# Patient Record
Sex: Female | Born: 1990 | Race: Black or African American | Hispanic: No | Marital: Married
Health system: Midwestern US, Community
[De-identification: ages and names within clinical notes are randomized; demographics above are authoritative.]

## PROBLEM LIST (undated history)

## (undated) DIAGNOSIS — E282 Polycystic ovarian syndrome: Secondary | ICD-10-CM

## (undated) HISTORY — DX: Polycystic ovarian syndrome: E28.2

## (undated) HISTORY — PX: WISDOM TOOTH EXTRACTION: SHX21

## (undated) HISTORY — PX: OTHER SURGICAL HISTORY: SHX169

---
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
## 2012-02-05 ENCOUNTER — Ambulatory Visit: Payer: Self-pay | Admitting: Neurology

## 2012-11-08 LAB — HM PAP SMEAR

## 2012-11-10 ENCOUNTER — Ambulatory Visit: Payer: Self-pay

## 2013-11-20 NOTE — ED Provider Notes (Signed)
[facility]  EMERGENCY DEPARTMENT TREATMENT REPORT  NAME:  Monica Boyer, Monica Boyer  SEX:   F  ADMIT: 11/20/2013  DOB:   1990/08/31  MR#    [id number]  ROOM:    TIME DICTATED: 02 04 AM  ACCT#  [id number]        CHIEF COMPLAINT:   Anxiety.     HISTORY OF PRESENT ILLNESS:  A 23 year old female who states she was in an argument with her husband and  started having a panic attack.  She has a history of panic attacks.  Medics  got there and they were able to calm her down and get her breathing under  control.  She states now she is feeling much better but just has a headache  and feels a little bit weak.  She denies pain anywhere.  She states she is  safe at home, does not worry about her safety.  She has not have any SI or HI.  She simply got panicky and could not calm herself down.  Again, with coaching  from medics she is feeling much better.     REVIEW OF SYSTEMS:  PULMONARY:  She was short of breath at the time, not now.  CARDIOVASCULAR:  No chest pain.  She had tightness at the time.  GASTROINTESTINAL:  No abdominal pain.  GENITOURINARY:  No dysuria.  NEUROLOGIC:  Headache, but no focal neurologic complaints.    PAST MEDICAL HISTORY:  None.    SOCIAL HISTORY:  No alcohol, tobacco or drug abuse.    MEDICATIONS:  None.    ALLERGIES:  NONE.    PHYSICAL EXAMINATION:  VITAL SIGNS:  Blood pressure 120/66, pulse 93, respiratory rate 18,  temperature 97.8, O2 sat 100% on room air.  GENERAL APPEARANCE:  Patient appears well developed and well nourished.  Appearance and behavior are age and situation appropriate.  HEENT:  Eyes:  Conjunctivae clear, lids normal.  Pupils equal, symmetrical,  and normally reactive.    RESPIRATORY:  Clear and equal breath sounds.  No respiratory distress,  tachypnea, or accessory muscle use.    CARDIOVASCULAR:   Heart regular, without murmurs, gallops, rubs, or thrills.   GASTROINTESTINAL:  Abdomen soft and nontender.  EXTREMITIES:  Without edema.  SKIN:  Warm and dry.   NEUROLOGIC:  She ambulates well.  She does not have any focal neurologic  findings.   PSYCHIATRIC:  She is awake, alert, oriented and interactive.    INITIAL ASSESSMENT:  A patient with anxiety/panic attack.  She is feeling better now.  Her husband  came.  She did have some increase in anxiety.  She has reiterated though that  she does not feel uncomfortable going home with him, she feels safe, she has  no fears for her safety or the safety of others.  I did give her 1 mg of oral  Ativan and she is going to be discharged home in stable condition.    DIAGNOSIS:  Acute stress reaction, anxiety.      ___________________  Christiana PellantBen A Lynford Espinoza MD  Dictated By: Marland Kitchen.     My signature above authenticates this document and my orders, the final  diagnosis (es), discharge prescription (s), and instructions in the [facility]  Pulsecheck record.  Nursing notes have been reviewed by the physician/mid-level provider.    If you have any questions please contact [phone].    JMB  D:11/20/2013 02:04:19  T: 11/20/2013 14:33:58  [id number]  Electronically Authenticated by:  Carlis StableBen A. Carmela HurtFickenscher, M.D. On 11/22/2013 04:50  AM EDT

## 2015-07-07 DIAGNOSIS — G43909 Migraine, unspecified, not intractable, without status migrainosus: Secondary | ICD-10-CM

## 2015-07-11 ENCOUNTER — Ambulatory Visit (INDEPENDENT_AMBULATORY_CARE_PROVIDER_SITE_OTHER): Payer: BLUE CROSS/BLUE SHIELD | Admitting: Unknown Physician Specialty

## 2015-07-11 ENCOUNTER — Encounter: Payer: Self-pay | Admitting: Unknown Physician Specialty

## 2015-07-11 VITALS — BP 126/76 | HR 72 | Temp 98.5°F | Ht 62.7 in | Wt 133.8 lb

## 2015-07-11 DIAGNOSIS — Z3046 Encounter for surveillance of implantable subdermal contraceptive: Secondary | ICD-10-CM | POA: Diagnosis not present

## 2015-07-11 DIAGNOSIS — Z30011 Encounter for initial prescription of contraceptive pills: Secondary | ICD-10-CM | POA: Diagnosis not present

## 2015-07-11 MED ORDER — LEVONORGESTREL-ETHINYL ESTRAD 0.1-20 MG-MCG PO TABS: 1.0000 | ORAL_TABLET | Freq: Every day | ORAL | Status: DC

## 2015-07-11 NOTE — Progress Notes (Signed)
   BP 126/76 mmHg  Pulse 72  Temp(Src) 98.5 F (36.9 C)  Ht 5' 2.7" (1.593 m)  Wt 133 lb 12.8 oz (60.691 kg)  BMI 23.92 kg/m2  SpO2 100%  LMP 04/09/2012 (Approximate)   Subjective:    Patient ID: Valerie Odonnell, female    DOB: [birth year], 25 y.o.   MRN: [id number]  HPI: Valerie Odonnell is a 25 y.o. female  No chief complaint on file. Nexplanon removal and family planning advice.  Pt with a Nexplanon for over 3 years and needs it removed.  She has no problems with it but wants to see if she can have a period.  She does however, need birth control  Relevant past medical, surgical, family and social history reviewed and updated as indicated. Interim medical history since our last visit reviewed. Allergies and medications reviewed and updated.  Review of Systems  Per HPI unless specifically indicated above     Objective:    BP 126/76 mmHg  Pulse 72  Temp(Src) 98.5 F (36.9 C)  Ht 5' 2.7" (1.593 m)  Wt 133 lb 12.8 oz (60.691 kg)  BMI 23.92 kg/m2  SpO2 100%  LMP 04/09/2012 (Approximate)  Wt Readings from Last 3 Encounters:  07/11/15 133 lb 12.8 oz (60.691 kg)  11/24/12 113 lb (51.256 kg)    Physical Exam  Constitutional: She is oriented to person, place, and time. She appears well-developed and well-nourished. No distress.  HENT:  Head: Normocephalic and atraumatic.  Eyes: Conjunctivae and lids are normal. Right eye exhibits no discharge. Left eye exhibits no discharge. No scleral icterus.  Cardiovascular: Normal rate.   Pulmonary/Chest: Effort normal.  Abdominal: Normal appearance. There is no splenomegaly or hepatomegaly.  Musculoskeletal: Normal range of motion.  Neurological: She is alert and oriented to person, place, and time.  Skin: Skin is intact. No rash noted. No pallor.  Psychiatric: She has a normal mood and affect. Her behavior is normal. Judgment and thought content normal.   Nexplanon located left upper arm.  Area infiltrated with lidocaine and  removed with forceps.  Steri strips applied to area.   Tolerated procedure well Results for orders placed or performed in visit on 07/07/15  HM PAP SMEAR  Result Value Ref Range   HM Pap smear from [facility]       Assessment & Plan:   Problem List Items Addressed This Visit    None    Visit Diagnoses    Nexplanon removal    -  Primary    Encounter for initial prescription of contraceptive pills           Signs and symptoms of infection discussed.   Follow up plan: Return if symptoms worsen or fail to improve.

## 2015-08-15 ENCOUNTER — Encounter: Payer: Self-pay | Admitting: Unknown Physician Specialty

## 2015-08-15 ENCOUNTER — Ambulatory Visit (INDEPENDENT_AMBULATORY_CARE_PROVIDER_SITE_OTHER): Payer: BLUE CROSS/BLUE SHIELD | Admitting: Unknown Physician Specialty

## 2015-08-15 VITALS — BP 118/69 | HR 61 | Temp 98.4°F | Ht 63.5 in | Wt 135.0 lb

## 2015-08-15 DIAGNOSIS — Z0000 Encounter for general adult medical examination without abnormal findings: Secondary | ICD-10-CM | POA: Diagnosis not present

## 2015-08-15 DIAGNOSIS — R87610 Atypical squamous cells of undetermined significance on cytologic smear of cervix (ASC-US): Secondary | ICD-10-CM | POA: Diagnosis not present

## 2015-08-15 DIAGNOSIS — R87810 Cervical high risk human papillomavirus (HPV) DNA test positive: Secondary | ICD-10-CM | POA: Diagnosis not present

## 2015-08-15 NOTE — Progress Notes (Signed)
BP 118/69 mmHg  Pulse 61  Temp(Src) 98.4 F (36.9 C)  Ht 5' 3.5" (1.613 m)  Wt 135 lb (61.236 kg)  BMI 23.54 kg/m2  SpO2 98%   Subjective:    Patient ID: Valerie Odonnell, female    DOB: [birth year], 25 y.o.   MRN: [id number]  HPI: Valerie Odonnell is a 25 y.o. female  Chief Complaint  Patient presents with  . Annual Exam    last pap 6/14 was ASCUS   Depression screen PHQ 2/9 08/15/2015  Decreased Interest 0  Down, Depressed, Hopeless 0  PHQ - 2 Score 0   Past Medical History  Diagnosis Date  . PCOS (polycystic ovarian syndrome)    Past Surgical History  Procedure Laterality Date  . Wisdom tooth extraction    . Torn ear lobe      Family History  Problem Relation Age of Onset  . Heart disease Maternal Grandfather   . Diabetes Paternal Grandfather         Relevant past medical, surgical, family and social history reviewed and updated as indicated. Interim medical history since our last visit reviewed. Allergies and medications reviewed and updated.  Review of Systems  Constitutional: Negative.   HENT: Negative.   Eyes: Negative.   Respiratory: Negative.   Cardiovascular: Negative.   Gastrointestinal: Negative.   Endocrine: Negative.   Genitourinary: Negative.   Musculoskeletal: Negative.   Skin: Negative.   Allergic/Immunologic: Negative.   Neurological: Negative.   Hematological: Negative.   Psychiatric/Behavioral: Negative.     Per HPI unless specifically indicated above     Objective:    BP 118/69 mmHg  Pulse 61  Temp(Src) 98.4 F (36.9 C)  Ht 5' 3.5" (1.613 m)  Wt 135 lb (61.236 kg)  BMI 23.54 kg/m2  SpO2 98%  Wt Readings from Last 3 Encounters:  08/15/15 135 lb (61.236 kg)  07/11/15 133 lb 12.8 oz (60.691 kg)  11/24/12 113 lb (51.256 kg)    Physical Exam  Constitutional: She is oriented to person, place, and time. She appears well-developed and well-nourished.  HENT:  Head: Normocephalic and atraumatic.  Eyes: Pupils are  equal, round, and reactive to light. Right eye exhibits no discharge. Left eye exhibits no discharge. No scleral icterus.  Neck: Normal range of motion. Neck supple. Carotid bruit is not present. No thyromegaly present.  Cardiovascular: Normal rate, regular rhythm and normal heart sounds.  Exam reveals no gallop and no friction rub.   No murmur heard. Pulmonary/Chest: Effort normal and breath sounds normal. No respiratory distress. She has no wheezes. She has no rales.  Abdominal: Soft. Bowel sounds are normal. There is no tenderness. There is no rebound.  Genitourinary: Vagina normal and uterus normal. Cervix exhibits friability. Cervix exhibits no motion tenderness and no discharge. Right adnexum displays no mass, no tenderness and no fullness. Left adnexum displays no mass, no tenderness and no fullness.  Musculoskeletal: Normal range of motion.  Lymphadenopathy:    She has no cervical adenopathy.  Neurological: She is alert and oriented to person, place, and time.  Skin: Skin is warm, dry and intact. No rash noted.  Psychiatric: She has a normal mood and affect. Her speech is normal and behavior is normal. Judgment and thought content normal. Cognition and memory are normal.    Results for orders placed or performed in visit on 07/07/15  HM PAP SMEAR  Result Value Ref Range   HM Pap smear from [facility]  Assessment & Plan:   Problem List Items Addressed This Visit      Unprioritized   Atypical squamous cell changes of undetermined significance (ASCUS) on cervical cytology with positive high risk human papilloma virus (HPV)    Pap done today      Relevant Orders   Pap Lb, rfx HPV ASCU    Other Visit Diagnoses    Annual physical exam    -  Primary    Relevant Orders    Comprehensive metabolic panel    CBC with Differential/Platelet    Lipid Panel w/o Chol/HDL Ratio    TSH        Follow up plan: Return if symptoms worsen or fail to improve, for and pap  results.

## 2015-08-15 NOTE — Assessment & Plan Note (Signed)
Pap done today  

## 2015-08-16 LAB — CBC WITH DIFFERENTIAL/PLATELET
BASOS ABS: 0 10*3/uL (ref 0.0–0.2)
Basos: 0 %
EOS (ABSOLUTE): 0.1 10*3/uL (ref 0.0–0.4)
EOS: 1 %
HEMATOCRIT: 38.7 % (ref 34.0–46.6)
HEMOGLOBIN: 12.7 g/dL (ref 11.1–15.9)
IMMATURE GRANS (ABS): 0 10*3/uL (ref 0.0–0.1)
Immature Granulocytes: 0 %
LYMPHS ABS: 2.9 10*3/uL (ref 0.7–3.1)
LYMPHS: 51 %
MCH: 28.3 pg (ref 26.6–33.0)
MCHC: 32.8 g/dL (ref 31.5–35.7)
MCV: 86 fL (ref 79–97)
MONOCYTES: 8 %
Monocytes Absolute: 0.4 10*3/uL (ref 0.1–0.9)
NEUTROS ABS: 2.2 10*3/uL (ref 1.4–7.0)
Neutrophils: 40 %
Platelets: 259 10*3/uL (ref 150–379)
RBC: 4.48 x10E6/uL (ref 3.77–5.28)
RDW: 13.6 % (ref 12.3–15.4)
WBC: 5.6 10*3/uL (ref 3.4–10.8)

## 2015-08-16 LAB — COMPREHENSIVE METABOLIC PANEL
ALBUMIN: 4.4 g/dL (ref 3.5–5.5)
ALT: 37 IU/L — AB (ref 0–32)
AST: 24 IU/L (ref 0–40)
Albumin/Globulin Ratio: 1.7 (ref 1.1–2.5)
Alkaline Phosphatase: 58 IU/L (ref 39–117)
BILIRUBIN TOTAL: 0.5 mg/dL (ref 0.0–1.2)
BUN / CREAT RATIO: 13 (ref 8–20)
BUN: 10 mg/dL (ref 6–20)
CALCIUM: 8.8 mg/dL (ref 8.7–10.2)
CHLORIDE: 102 mmol/L (ref 96–106)
CO2: 19 mmol/L (ref 18–29)
CREATININE: 0.77 mg/dL (ref 0.57–1.00)
GFR calc non Af Amer: 109 mL/min/{1.73_m2} (ref >59)
GFR, EST AFRICAN AMERICAN: 126 mL/min/{1.73_m2} (ref >59)
GLUCOSE: 79 mg/dL (ref 65–99)
Globulin, Total: 2.6 g/dL (ref 1.5–4.5)
Potassium: 4.2 mmol/L (ref 3.5–5.2)
Sodium: 139 mmol/L (ref 134–144)
TOTAL PROTEIN: 7 g/dL (ref 6.0–8.5)

## 2015-08-16 LAB — LIPID PANEL W/O CHOL/HDL RATIO
CHOLESTEROL TOTAL: 162 mg/dL (ref 100–199)
HDL: 53 mg/dL (ref >39)
LDL CALC: 96 mg/dL (ref 0–99)
TRIGLYCERIDES: 67 mg/dL (ref 0–149)
VLDL CHOLESTEROL CAL: 13 mg/dL (ref 5–40)

## 2015-08-16 LAB — TSH: TSH: 1.74 u[IU]/mL (ref 0.450–4.500)

## 2015-08-20 LAB — PAP LB, RFX HPV ASCU: PAP SMEAR COMMENT: 0

## 2015-09-09 ENCOUNTER — Other Ambulatory Visit: Payer: Self-pay | Admitting: Unknown Physician Specialty

## 2015-09-09 MED ORDER — LEVONORGESTREL-ETHINYL ESTRAD 0.1-20 MG-MCG PO TABS: 1.0000 | ORAL_TABLET | Freq: Every day | ORAL | Status: DC

## 2015-09-09 NOTE — Telephone Encounter (Signed)
Routing to provider. Patient was seen 08/15/15.

## 2015-09-09 NOTE — Telephone Encounter (Signed)
Pt called would like to know if a 3 month supply of her birth control can be sent to [facility] [place]. Thanks.

## 2015-09-23 ENCOUNTER — Telehealth: Payer: Self-pay | Admitting: Unknown Physician Specialty

## 2015-09-23 DIAGNOSIS — Z30017 Encounter for initial prescription of implantable subdermal contraceptive: Secondary | ICD-10-CM

## 2015-09-23 NOTE — Telephone Encounter (Signed)
Called and spoke to patient. She states she has had her period for the past 3 weeks. She states it has been bright red in color the entire time and has had a little bit of clotting. Patient would like to know what she should do.

## 2015-09-23 NOTE — Telephone Encounter (Signed)
Typically, this resolves on it's own with patience.  If she would like it to stop, I can change her [facility] which might be helpful.  Please let her know she is still protected from pregnancy

## 2015-09-23 NOTE — Telephone Encounter (Signed)
Pt called would to speak to a nurse or CMA concerning her birth control, pt stated she has had her period consistently for the past 3 weeks. Please call pt ASAP. Thanks.

## 2015-09-23 NOTE — Telephone Encounter (Signed)
OK.  Will put order in

## 2015-09-23 NOTE — Telephone Encounter (Signed)
Called and let patient know what Elnita MaxwellCheryl said. Patient stated she has been thinking about doing the nexplanon but she knows that Elnita MaxwellCheryl does not place them. Patient would like a referral [facility] for nex[facility]on.

## 2015-10-01 ENCOUNTER — Ambulatory Visit (INDEPENDENT_AMBULATORY_CARE_PROVIDER_SITE_OTHER): Payer: BLUE CROSS/BLUE SHIELD | Admitting: Obstetrics & Gynecology

## 2015-10-01 ENCOUNTER — Encounter: Payer: Self-pay | Admitting: Obstetrics & Gynecology

## 2015-10-01 ENCOUNTER — Encounter: Payer: Self-pay | Admitting: *Deleted

## 2015-10-01 VITALS — BP 132/77 | HR 73 | Resp 18 | Ht 63.0 in | Wt 133.0 lb

## 2015-10-01 DIAGNOSIS — Z30017 Encounter for initial prescription of implantable subdermal contraceptive: Secondary | ICD-10-CM

## 2015-10-01 DIAGNOSIS — Z01812 Encounter for preprocedural laboratory examination: Secondary | ICD-10-CM

## 2015-10-01 LAB — POCT URINE PREGNANCY: PREG TEST UR: NEGATIVE

## 2015-10-01 MED ORDER — ETONOGESTREL 68 MG ~~LOC~~ IMPL
68.0000 mg | DRUG_IMPLANT | Freq: Once | SUBCUTANEOUS | Status: AC
  Administered 2015-10-01: 68 mg via SUBCUTANEOUS

## 2015-10-01 NOTE — Addendum Note (Signed)
Addended by: Gita KudoLASSITER, Sem Mccaughey S on: 10/01/2015 10:10 AM   Modules accepted: Orders

## 2015-10-01 NOTE — Progress Notes (Signed)
Pt currently using birth control pills and is not satisfied with that method of contraception, would like to have the Nexplanon.

## 2015-10-01 NOTE — Progress Notes (Signed)
   Subjective:    Patient ID: Valerie Odonnell, female    DOB: August 30, 1990, 25 y.o.   MRN: [id number]  HPI  25 yo AA G0 lady here for insertion of her second Nexplanon. She had her first Nexplanon for a little more than 3 years. She had it removed and started OCPs in 2/17. She has had irregular bleeding since and would like to get another Nexplanon. She was amenorrheic with her first Nexplanon. She is still taking her OCPS  Review of Systems Pap smear negative last month    Objective:   Physical Exam UPT was negative. Consent was signed. Time out procedure was done. Her left arm was prepped with betadine and infiltrated with 3 cc of 1% lidocaine. After adequate anesthesia was assured, the Nexplanon device was placed according to standard of care. Her arm was hemostatic and was bandaged. She tolerated the procedure well.     Assessment & Plan:  Contraception- Nexplanon- rec finish her current pack of OCPs

## 2015-10-13 ENCOUNTER — Telehealth: Payer: Self-pay

## 2015-10-13 NOTE — Telephone Encounter (Signed)
It would be her choice.  Either is fine for us and medically sound

## 2015-10-13 NOTE — Telephone Encounter (Signed)
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

## 2015-10-14 ENCOUNTER — Ambulatory Visit: Payer: BLUE CROSS/BLUE SHIELD

## 2015-10-14 DIAGNOSIS — Z8619 Personal history of other infectious and parasitic diseases: Secondary | ICD-10-CM

## 2015-10-14 DIAGNOSIS — Z0184 Encounter for antibody response examination: Secondary | ICD-10-CM

## 2015-10-15 ENCOUNTER — Encounter: Payer: Self-pay | Admitting: Unknown Physician Specialty

## 2015-10-15 LAB — VARICELLA ZOSTER ANTIBODY, IGM

## 2015-10-15 LAB — VARICELLA ZOSTER ANTIBODY, IGG: Varicella zoster IgG: 1922 index (ref >165)

## 2015-10-15 NOTE — Telephone Encounter (Signed)
Patient decided to have titer drawn.

## 2015-10-28 ENCOUNTER — Telehealth: Payer: Self-pay | Admitting: Unknown Physician Specialty

## 2015-10-28 NOTE — Telephone Encounter (Signed)
Called and scheduled patient an appointment for Friday at 11.

## 2015-10-28 NOTE — Telephone Encounter (Signed)
Pt called and stated that she is experiencing a sharp pain in her chest from time to time and she would like to know what she should do. She said it feels like spasms but she is unsure and after dealing with it for a couple months her mom has now suggested that she speak to her PCP.

## 2015-10-28 NOTE — Telephone Encounter (Signed)
I would like to see her for this if that is OK.  It's hard to give advice about chest pain over the phone

## 2015-10-28 NOTE — Telephone Encounter (Signed)
Routing to provider for advice.

## 2015-10-31 ENCOUNTER — Ambulatory Visit (INDEPENDENT_AMBULATORY_CARE_PROVIDER_SITE_OTHER): Payer: BLUE CROSS/BLUE SHIELD | Admitting: Unknown Physician Specialty

## 2015-10-31 ENCOUNTER — Encounter: Payer: Self-pay | Admitting: Unknown Physician Specialty

## 2015-10-31 VITALS — BP 117/75 | HR 66 | Temp 98.5°F | Ht 63.2 in | Wt 131.6 lb

## 2015-10-31 DIAGNOSIS — R87610 Atypical squamous cells of undetermined significance on cytologic smear of cervix (ASC-US): Secondary | ICD-10-CM | POA: Diagnosis not present

## 2015-10-31 DIAGNOSIS — R87810 Cervical high risk human papillomavirus (HPV) DNA test positive: Secondary | ICD-10-CM | POA: Diagnosis not present

## 2015-10-31 DIAGNOSIS — R079 Chest pain, unspecified: Secondary | ICD-10-CM | POA: Diagnosis not present

## 2015-10-31 NOTE — Progress Notes (Signed)
BP 117/75 mmHg  Pulse 66  Temp(Src) 98.5 F (36.9 C)  Ht 5' 3.2" (1.605 m)  Wt 131 lb 9.6 oz (59.693 kg)  BMI 23.17 kg/m2  SpO2 98%  LMP 09/03/2015 (Exact Date)   Subjective:    Patient ID: Valerie Odonnell, female    DOB: December 02, 1990, 25 y.o.   MRN: [id number]  HPI: Valerie MazeLatisha K Regina is a 25 y.o. female  Chief Complaint  Patient presents with  . Chest Pain    pt states she has had a sharp pain just to the right side of center of her chest. States the pain comes and goes and she first noticed it a few months ago.    Chest pain Points to the right side of the sternum in which she has intermittent sharp chest pain that seems to be worse in the morning.  Lasted 2 hours this AM and tends to get better as the day goes on.  If she tries to move she feels like it needs to crack and pop.  States sometimes it hurts going up stairs and running.  States it happens the majority of time and improves with rest.  Some nausea, no vomiting.  Symptoms have been ongoing for about 3 months and have not changed.    Relevant past medical, surgical, family and social history reviewed and updated as indicated. Interim medical history since our last visit reviewed. Allergies and medications reviewed and updated.  Review of Systems  Per HPI unless specifically indicated above     Objective:    BP 117/75 mmHg  Pulse 66  Temp(Src) 98.5 F (36.9 C)  Ht 5' 3.2" (1.605 m)  Wt 131 lb 9.6 oz (59.693 kg)  BMI 23.17 kg/m2  SpO2 98%  LMP 09/03/2015 (Exact Date)  Wt Readings from Last 3 Encounters:  10/31/15 131 lb 9.6 oz (59.693 kg)  10/01/15 133 lb (60.328 kg)  08/15/15 135 lb (61.236 kg)    Physical Exam  Constitutional: She is oriented to person, place, and time. She appears well-developed and well-nourished. No distress.  HENT:  Head: Normocephalic and atraumatic.  Eyes: Conjunctivae and lids are normal. Right eye exhibits no discharge. Left eye exhibits no discharge. No scleral icterus.  Neck:  Normal range of motion. Neck supple. No JVD present. Carotid bruit is not present.  Cardiovascular: Normal rate, regular rhythm and normal heart sounds.   Pulmonary/Chest: Effort normal and breath sounds normal.  Abdominal: Normal appearance. There is no splenomegaly or hepatomegaly.  Musculoskeletal: Normal range of motion.  Neurological: She is alert and oriented to person, place, and time.  Skin: Skin is warm, dry and intact. No rash noted. No pallor.  Psychiatric: She has a normal mood and affect. Her behavior is normal. Judgment and thought content normal.   EKG WNL Lipid panel from last visit reviewed and WNL  Results for orders placed or performed in visit on 10/14/15  Varicella zoster antibody, IgG  Result Value Ref Range   Varicella zoster IgG 1922 Immune >165 index  Varicella zoster antibody, IgM  Result Value Ref Range   Varicella IgM <0.91 0.00 - 0.90 index      Assessment & Plan:   Problem List Items Addressed This Visit    None    Visit Diagnoses    Chest pain, unspecified chest pain type    -  Primary    EKG without acute changes.  This seems to be chest wall pain.  However, due to exertional pain, refer  to cardiology for further evaluation    Relevant Orders    EKG 12-Lead (Completed)    DG Chest 2 View    Ambulatory referral to Cardiology        Follow up plan: Return if symptoms worsen or fail to improve, for and with cardiology.

## 2015-11-04 ENCOUNTER — Ambulatory Visit
Admission: RE | Admit: 2015-11-04 | Discharge: 2015-11-04 | Disposition: A | Discharge status: Home / Self Care | Payer: BLUE CROSS/BLUE SHIELD | Source: Ambulatory Visit | Attending: Unknown Physician Specialty | Admitting: Unknown Physician Specialty

## 2015-11-04 DIAGNOSIS — R079 Chest pain, unspecified: Secondary | ICD-10-CM | POA: Insufficient documentation

## 2015-11-25 ENCOUNTER — Encounter: Payer: Self-pay | Admitting: Cardiology

## 2015-11-25 ENCOUNTER — Ambulatory Visit (INDEPENDENT_AMBULATORY_CARE_PROVIDER_SITE_OTHER): Payer: BLUE CROSS/BLUE SHIELD | Admitting: Cardiology

## 2015-11-25 VITALS — BP 110/62 | HR 81 | Ht 63.0 in | Wt 135.8 lb

## 2015-11-25 DIAGNOSIS — R079 Chest pain, unspecified: Secondary | ICD-10-CM | POA: Diagnosis not present

## 2015-11-25 DIAGNOSIS — R0602 Shortness of breath: Secondary | ICD-10-CM | POA: Diagnosis not present

## 2015-11-25 DIAGNOSIS — R9431 Abnormal electrocardiogram [ECG] [EKG]: Secondary | ICD-10-CM

## 2015-11-25 NOTE — Patient Instructions (Addendum)
Medication Instructions:  Your physician recommends that you continue on your current medications as directed. Please refer to the Current Medication list given to you today.   Labwork: None ordered  Testing/Procedures: Your physician has requested that you have an echocardiogram. Echocardiography is a painless test that uses sound waves to create images of your heart. It provides your doctor with information about the size and shape of your heart and how well your heart's chambers and valves are working. This procedure takes approximately one hour. There are no restrictions for this procedure.  Date & Time: _________________________________________________________________  Your physician has requested that you have a stress echocardiogram. For further information please visit [email or web address]. Please follow instruction sheet as given.  Date & Time: _______________________________________________________________  Follow-Up: Your physician recommends that you schedule a follow-up appointment as needed. We will call you with results and if needed schedule a follow up at that time.  It was a pleasure seeing you today here in the office. Please do not hesitate to give us a call back if you have any further questions. [phone]  Tigard CellarPamela A. RN, BSN      Any Other Special Instructions Will Be Listed Below (If Applicable).     If you need a refill on your cardiac medications before your next appointment, please call your pharmacy.  Echocardiogram An echocardiogram, or echocardiography, uses sound waves (ultrasound) to produce an image of your heart. The echocardiogram is simple, painless, obtained within a short period of time, and offers valuable information to your health care provider. The images from an echocardiogram can provide information such as:  Evidence of coronary artery disease (CAD).  Heart size.  Heart muscle function.  Heart valve function.  Aneurysm  detection.  Evidence of a past heart attack.  Fluid buildup around the heart.  Heart muscle thickening.  Assess heart valve function. LET [facility] KNOW ABOUT:  Any allergies you have.  All medicines you are taking, including vitamins, herbs, eye drops, creams, and over-the-counter medicines.  Previous problems you or members of your family have had with the use of anesthetics.  Any blood disorders you have.  Previous surgeries you have had.  Medical conditions you have.  Possibility of pregnancy, if this applies. BEFORE THE PROCEDURE  No special preparation is needed. Eat and drink normally.  PROCEDURE   In order to produce an image of your heart, gel will be applied to your chest and a wand-like tool (transducer) will be moved over your chest. The gel will help transmit the sound waves from the transducer. The sound waves will harmlessly bounce off your heart to allow the heart images to be captured in real-time motion. These images will then be recorded.  You may need an IV to receive a medicine that improves the quality of the pictures. AFTER THE PROCEDURE You may return to your normal schedule including diet, activities, and medicines, unless your health care provider tells you otherwise.   This information is not intended to replace advice given to you by your health care provider. Make sure you discuss any questions you have with your health care provider.   Document Released: 05/21/2000 Document Revised: 06/14/2014 Document Reviewed: 01/29/2013 Elsevier Interactive Patient Education 2016 [employer]    Exercise Stress Echocardiogram An exercise stress echocardiogram is a heart (cardiac) test used to check the function of your heart. This test may also be called an exercise stress echocardiography or stress echo. This stress test will check how well your heart  muscle and valves are working and determine if your heart muscle is getting enough blood.  You will exercise on a treadmill to naturally increase or stress the functioning of your heart.  An echocardiogram uses sound waves (ultrasound) to produce an image of your heart. If your heart does not work normally, it may indicate coronary artery disease with poor coronary blood supply. The coronary arteries are the arteries that bring blood and oxygen to your heart. LET [facility] CARE PROVIDER KNOW ABOUT:  Any allergies you have.  All medicines you are taking, including vitamins, herbs, eye drops, creams, and over-the-counter medicines.  Previous problems you or members of your family have had with the use of anesthetics.  Any blood disorders you have.  Previous surgeries you have had.  Medical conditions you have.  Possibility of pregnancy, if this applies. RISKS AND COMPLICATIONS Generally, this is a safe procedure. However, as with any procedure, complications can occur. Possible complications can include:  You develop pain or pressure in the following areas:  Chest.  Jaw or neck.  Between your shoulder blades.  Radiating down your left arm.  Dizziness or lightheadedness.  Shortness of breath.  Increased or irregular heartbeat.  Nausea or vomiting.  Heart attack (rare). BEFORE THE PROCEDURE  Avoid all forms of caffeine for 24 hours before your test or as directed by your health care provider. This includes coffee, tea (even decaffeinated tea), caffeinated sodas, chocolate, cocoa, and certain pain medicines.  Follow your health care provider's instructions regarding eating and drinking before the test.  Take your medicines as directed at regular times with water unless instructed otherwise. Exceptions may include:  If you have diabetes, ask how you are to take your insulin or pills. It is common to adjust insulin dosing the morning of the test.  If you are taking beta-blocker medicines, it is important to talk to your health care provider about these medicines  well before the date of your test. Taking beta-blocker medicines may interfere with the test. In some cases, these medicines need to be changed or stopped 24 hours or more before the test.  If you wear a nitroglycerin patch, it may need to be removed prior to the test. Ask your health care provider if the patch should be removed before the test.  If you use an inhaler for any breathing condition, bring it with you to the test.  If you are an outpatient, bring a snack so you can eat right after the stress phase of the test.  Do not smoke for 4 hours prior to the test or as directed by your health care provider.  Wear loose-fitting clothes and comfortable shoes for the test. This test involves walking on a treadmill. PROCEDURE   Multiple electrodes will be put on your chest. If needed, small areas of your chest may be shaved to get better contact with the electrodes. Once the electrodes are attached to your body, multiple wires will be attached to the electrodes, and your heart rate will be monitored.  You will have an echocardiogram done at rest.  To produce this image of your heart, gel is applied to your chest, and a wand-like tool (transducer) is moved over the chest. The transducer sends the sound waves through the chest to create the moving images of your heart.  You may need an IV to receive a medication that improves the quality of the pictures.  You will then walk on a treadmill. The treadmill will be started  at a slow pace. The treadmill speed and incline will gradually be increased to raise your heart rate.  At the peak of exercise, the treadmill will be stopped. You will lie down immediately on a bed so that a second echocardiogram can be done to visualize your heart's motion with exercise.  The test usually takes 30-60 minutes to complete. AFTER THE PROCEDURE  Your heart rate and blood pressure will be monitored after the test.  You may return to your normal schedule,  including diet, activities, and medicines, unless your health care provider tells you otherwise.   This information is not intended to replace advice given to you by your health care provider. Make sure you discuss any questions you have with your health care provider.   Document Released: 05/28/2004 Document Revised: 05/29/2013 Document Reviewed: 01/29/2013 Elsevier Interactive Patient Education [employer]

## 2015-11-25 NOTE — Progress Notes (Signed)
Cardiology Office Note   Date:  11/25/2015   ID:  Valerie Odonnell, DOB [birth year], MRN [id number]  Referring Doctor:  Gabriel Cirriheryl Wicker, NP   Cardiologist:   Almond LintAileen Masha Orbach, MD   Reason for consultation:  Chief Complaint  Patient presents with  . other    Chest pain . Meds reviewed verbally with pt.      History of Present Illness: Valerie Odonnell is a 25 y.o. female who presents for  Chest pain. This has been going on for a few months now. She describes a chest pain that comes on upon waking up. She describes it as a cracking or popping sensation in the middle of her chest, randomly occurring, mild in intensity, nonradiating. She has another kind of chest pain that comes on with exertion. She feels that this is deeper in location in the center of the chest, nonradiating, tightness and sharp at the same time,  6-7 out of 10 severity, lasting to 15 minutes at a time occurs with exertion relieved with rest.   No loss of consciousness, shortness of breath, PND, orthopnea, edema. No fever no cough no colds no abdominal pain.   ROS:  Please see the history of present illness. Aside from mentioned under HPI, all other systems are reviewed and negative.     Past Medical History  Diagnosis Date  . PCOS (polycystic ovarian syndrome)     Past Surgical History  Procedure Laterality Date  . Wisdom tooth extraction    . Torn ear lobe       reports that she has never smoked. She has never used smokeless tobacco. She reports that she does not drink alcohol or use illicit drugs.   family history includes Diabetes in her paternal grandfather; Heart disease in her maternal grandfather.   Current Outpatient Prescriptions  Medication Sig Dispense Refill  . etonogestrel (NEXPLANON) 68 MG IMPL implant 1 each by Subdermal route once.     No current facility-administered medications for this visit.    Allergies: Review of patient's allergies indicates no known allergies.    PHYSICAL  EXAM: VS:  BP 110/62 mmHg  Pulse 81  Ht 5\' 3"  (1.6 m)  Wt 135 lb 12 oz (61.576 kg)  BMI 24.05 kg/m2  LMP 09/03/2015 (Approximate) , Body mass index is 24.05 kg/(m^2). Wt Readings from Last 3 Encounters:  11/25/15 135 lb 12 oz (61.576 kg)  10/31/15 131 lb 9.6 oz (59.693 kg)  10/01/15 133 lb (60.328 kg)    GENERAL:  well developed, well nourished, not in acute distress HEENT: normocephalic, pink conjunctivae, anicteric sclerae, no xanthelasma, normal dentition, oropharynx clear NECK:  no neck vein engorgement, JVP normal, no hepatojugular reflux, carotid upstroke brisk and symmetric, no bruit, no thyromegaly, no lymphadenopathy LUNGS:  good respiratory effort, clear to auscultation bilaterally CV:  PMI not displaced, no thrills, no lifts, S1 and S2 within normal limits, no palpable S3 or S4, no murmurs, no rubs, no gallops ABD:  Soft, nontender, nondistended, normoactive bowel sounds, no abdominal aortic bruit, no hepatomegaly, no splenomegaly MS: nontender back, no kyphosis, no scoliosis, no joint deformities EXT:  2+ DP/PT pulses, no edema, no varicosities, no cyanosis, no clubbing SKIN: warm, nondiaphoretic, normal turgor, no ulcers NEUROPSYCH: alert, oriented to person, place, and time, sensory/motor grossly intact, normal mood, appropriate affect  Recent Labs: 08/15/2015: ALT 37*; BUN 10; Creatinine, Ser 0.77; Platelets 259; Potassium 4.2; Sodium 139; TSH 1.740   Lipid Panel    Component Value Date/Time  CHOL 162 08/15/2015 1544   TRIG 67 08/15/2015 1544   HDL 53 08/15/2015 1544   LDLCALC 96 08/15/2015 1544     Other studies Reviewed:  EKG:  The ekg from  11/25/2015 was personally reviewed by me and it revealed  Sinus rhythm, 81 BPM. Nonspecific T-wave abnormality.  Additional studies/ records that were reviewed personally reviewed by me today include:  None available   ASSESSMENT AND PLAN:   Chest pain  Abnormal EKG/nonspecific T-wave abnormality  some atypical and  typical features.  There may be a musculoskeletal component to her chest pain.Patient is at low risk for CAD due to age and no significant medical problems. Recommend stress echocardiogram for reassurance. Recommend echocardiogram.  Current medicines are reviewed at length with the patient today.  The patient does not have concerns regarding medicines.  Labs/ tests ordered today include:  Orders Placed This Encounter  Procedures  . EKG 12-Lead  . ECHOCARDIOGRAM COMPLETE  . ECHO STRESS TEST    I had a lengthy and detailed discussion with the patient regarding diagnoses, prognosis, diagnostic options, treatment options.   I counseled the patient on importance of lifestyle modification including heart healthy diet, regular physical activity .   Disposition:   FU with undersigned after tests  When necessary   Signed, Almond Lint, MD  11/25/2015 1:45 PM    [facility]

## 2015-12-17 ENCOUNTER — Other Ambulatory Visit: Payer: BLUE CROSS/BLUE SHIELD

## 2016-01-01 ENCOUNTER — Ambulatory Visit (INDEPENDENT_AMBULATORY_CARE_PROVIDER_SITE_OTHER): Payer: BLUE CROSS/BLUE SHIELD

## 2016-01-01 ENCOUNTER — Other Ambulatory Visit: Payer: Self-pay

## 2016-01-01 DIAGNOSIS — R9431 Abnormal electrocardiogram [ECG] [EKG]: Secondary | ICD-10-CM

## 2016-01-01 DIAGNOSIS — R079 Chest pain, unspecified: Secondary | ICD-10-CM

## 2016-01-01 LAB — ECHOCARDIOGRAM STRESS TEST
CHL CUP RESTING HR STRESS: 97 {beats}/min
CHL CUP STRESS STAGE 1 HR: 97 {beats}/min
CHL CUP STRESS STAGE 2 GRADE: 0 %
CHL CUP STRESS STAGE 2 HR: 102 {beats}/min
CHL CUP STRESS STAGE 2 SPEED: 0 mph
CHL CUP STRESS STAGE 3 GRADE: 0 %
CHL CUP STRESS STAGE 3 SPEED: 0 mph
CHL CUP STRESS STAGE 4 DBP: 71 mmHg
CHL CUP STRESS STAGE 4 GRADE: 10 %
CHL CUP STRESS STAGE 5 DBP: 74 mmHg
CHL CUP STRESS STAGE 5 SBP: 188 mmHg
CHL CUP STRESS STAGE 6 GRADE: 14 %
CHL CUP STRESS STAGE 7 GRADE: 16 %
CHL CUP STRESS STAGE 7 HR: 190 {beats}/min
CHL CUP STRESS STAGE 8 SBP: 175 mmHg
CHL CUP STRESS STAGE 8 SPEED: 0 mph
CHL CUP STRESS STAGE 9 GRADE: 0 %
CHL CUP STRESS STAGE 9 HR: 112 {beats}/min
CHL CUP STRESS STAGE 9 SBP: 132 mmHg
CHL CUP STRESS STAGE 9 SPEED: 0 mph
CSEPEDS: 0 s
CSEPHR: 98 %
Estimated workload: 11.7 METS
Exercise duration (min): 10 min
MPHR: 196 {beats}/min
Peak HR: 190 {beats}/min
Percent of predicted max HR: 96 %
Stage 1 Grade: 0 %
Stage 1 Speed: 0 mph
Stage 2 DBP: 80 mmHg
Stage 2 SBP: 137 mmHg
Stage 3 HR: 102 {beats}/min
Stage 4 HR: 155 {beats}/min
Stage 4 SBP: 142 mmHg
Stage 4 Speed: 1.7 mph
Stage 5 Grade: 12 %
Stage 5 HR: 169 {beats}/min
Stage 5 Speed: 2.5 mph
Stage 6 DBP: 55 mmHg
Stage 6 HR: 184 {beats}/min
Stage 6 SBP: 174 mmHg
Stage 6 Speed: 3.4 mph
Stage 7 Speed: 4.2 mph
Stage 8 DBP: 71 mmHg
Stage 8 Grade: 0 %
Stage 8 HR: 150 {beats}/min
Stage 9 DBP: 66 mmHg

## 2016-02-04 ENCOUNTER — Encounter: Payer: Self-pay | Admitting: Family Medicine

## 2016-02-04 ENCOUNTER — Ambulatory Visit (INDEPENDENT_AMBULATORY_CARE_PROVIDER_SITE_OTHER): Payer: BLUE CROSS/BLUE SHIELD | Admitting: Family Medicine

## 2016-02-04 VITALS — BP 121/78 | HR 81 | Temp 97.8°F | Wt 133.0 lb

## 2016-02-04 DIAGNOSIS — N390 Urinary tract infection, site not specified: Secondary | ICD-10-CM | POA: Diagnosis not present

## 2016-02-04 DIAGNOSIS — K640 First degree hemorrhoids: Secondary | ICD-10-CM

## 2016-02-04 MED ORDER — HYDROCORTISONE 2.5 % RE CREA: 1.0000 "application " | TOPICAL_CREAM | Freq: Two times a day (BID) | RECTAL | 0 refills | Status: DC

## 2016-02-04 MED ORDER — SULFAMETHOXAZOLE-TRIMETHOPRIM 800-160 MG PO TABS: 1.0000 | ORAL_TABLET | Freq: Two times a day (BID) | ORAL | 0 refills | Status: DC

## 2016-02-04 NOTE — Patient Instructions (Signed)
Follow up as needed

## 2016-02-04 NOTE — Progress Notes (Signed)
BP 121/78   Pulse 81   Temp 97.8 F (36.6 C)   Wt 133 lb (60.3 kg)   LMP 08/05/2015 (Approximate)   SpO2 100%   BMI 23.56 kg/m    Subjective:    Patient ID: Valerie Odonnell, female    DOB: 18-Aug-1990, 25 y.o.   MRN: [id number]  HPI: Valerie Odonnell is a 25 y.o. female  Chief Complaint  Patient presents with  . Urinary Tract Infection    started Sun or Monday with urgency, frequency and "tingles" and has a bump under the skin near her anal area that is uncomfortable sometimes with wiping.    Patient presents with 3 day history of urinary frequency, urgency, dysuria, and fatigue. Denies hematuria, fever, chills, back pain, N/V. Has not tried taking anything at this time. Admits to not staying very well hydrated lately.   Also c/o a small bump near anus that has been painful, first noticed over the weekend. BMs have been normal. No blood in stools.   Relevant past medical, surgical, family and social history reviewed and updated as indicated. Interim medical history since our last visit reviewed. Allergies and medications reviewed and updated.  Review of Systems  Constitutional: Positive for fatigue.  HENT: Negative.   Respiratory: Negative.   Cardiovascular: Negative.   Gastrointestinal: Negative for abdominal pain and blood in stool.       Pain near anus  Genitourinary: Positive for dysuria, frequency and urgency.  Musculoskeletal: Negative.  Negative for back pain.  Neurological: Negative.   Psychiatric/Behavioral: Negative.     Per HPI unless specifically indicated above     Objective:    BP 121/78   Pulse 81   Temp 97.8 F (36.6 C)   Wt 133 lb (60.3 kg)   LMP 08/05/2015 (Approximate)   SpO2 100%   BMI 23.56 kg/m   Wt Readings from Last 3 Encounters:  02/04/16 133 lb (60.3 kg)  11/25/15 135 lb 12 oz (61.6 kg)  10/31/15 131 lb 9.6 oz (59.7 kg)    Physical Exam  Constitutional: She is oriented to person, place, and time. She appears well-developed and  well-nourished. No distress.  HENT:  Head: Normocephalic and atraumatic.  Eyes: Conjunctivae are normal. No scleral icterus.  Neck: Normal range of motion. Neck supple.  Cardiovascular: Normal rate, regular rhythm and normal heart sounds.   Pulmonary/Chest: Effort normal and breath sounds normal. No respiratory distress.  Abdominal: Soft. Bowel sounds are normal. She exhibits no distension. There is no tenderness.  Genitourinary:  Genitourinary Comments: Small hemorrhoid at 6 o'clock on anal opening, non-thrombosed  Musculoskeletal: Normal range of motion.  No CVA tenderness  Lymphadenopathy:    She has no cervical adenopathy.  Neurological: She is alert and oriented to person, place, and time.  Skin: Skin is warm and dry.  Psychiatric: She has a normal mood and affect. Her behavior is normal.      Assessment & Plan:   Problem List Items Addressed This Visit    None    Visit Diagnoses    Lower urinary tract infection    -  Primary   Bactrim sent. Increase fluids, cranberry juice, urinate frequently and void completely. Follow up if no improvement for recheck U/A   Relevant Medications   sulfamethoxazole-trimethoprim (BACTRIM DS,SEPTRA DS) 800-160 MG tablet   Other Relevant Orders   UA/M w/rflx Culture, Routine (STAT) (Completed)   First degree hemorrhoids       Anusol sent. Discussed importance of keeping  stools soft and regular to keep from progression. Increase fluids and fiber intake. Can add miralax prn    Will await urine cx.   Follow up plan: Return if symptoms worsen or fail to improve.

## 2016-02-08 LAB — UA/M W/RFLX CULTURE, ROUTINE
BILIRUBIN UA: NEGATIVE
Glucose, UA: NEGATIVE
Ketones, UA: NEGATIVE
Nitrite, UA: NEGATIVE
PH UA: 7 (ref 5.0–7.5)
Specific Gravity, UA: 1.025 (ref 1.005–1.030)
UUROB: 1 mg/dL (ref 0.2–1.0)

## 2016-02-08 LAB — MICROSCOPIC EXAMINATION

## 2016-02-08 LAB — URINE CULTURE, REFLEX

## 2017-04-06 ENCOUNTER — Ambulatory Visit (INDEPENDENT_AMBULATORY_CARE_PROVIDER_SITE_OTHER): Payer: BLUE CROSS/BLUE SHIELD | Admitting: Family Medicine

## 2017-04-06 ENCOUNTER — Encounter: Payer: Self-pay | Admitting: Family Medicine

## 2017-04-06 VITALS — BP 120/79 | HR 79 | Temp 98.6°F | Wt 139.0 lb

## 2017-04-06 DIAGNOSIS — J069 Acute upper respiratory infection, unspecified: Secondary | ICD-10-CM | POA: Diagnosis not present

## 2017-04-06 MED ORDER — AZITHROMYCIN 250 MG PO TABS: ORAL_TABLET | ORAL | 0 refills | Status: DC

## 2017-04-06 NOTE — Progress Notes (Signed)
   BP 120/79   Pulse 79   Temp 98.6 F (37 C)   Wt 139 lb (63 kg)   SpO2 99%   BMI 24.62 kg/m    Subjective:    Patient ID: Valerie Odonnell, female    DOB: 1991-03-07, 26 y.o.   MRN: [id number]  HPI: Valerie Odonnell is a 26 y.o. female  Chief Complaint  Patient presents with  . URI    Over a week, head congestion, runny nose, non productive cough, SOB, slight ear ache, some sinus drainage. No fever, no sore throat.    Congestion, ear pain, dry cough, DOE, wheezing x 10 days. Denies fever, chills body aches, CP. Trying theraflu with temporary relief. No sick contacts.   Relevant past medical, surgical, family and social history reviewed and updated as indicated. Interim medical history since our last visit reviewed. Allergies and medications reviewed and updated.  Review of Systems  Constitutional: Negative.   HENT: Positive for congestion and ear pain.   Eyes: Negative.   Respiratory: Positive for cough, shortness of breath and wheezing.   Cardiovascular: Negative.   Gastrointestinal: Negative.   Genitourinary: Negative.   Musculoskeletal: Negative.   Skin: Negative.   Neurological: Negative.   Psychiatric/Behavioral: Negative.    Per HPI unless specifically indicated above     Objective:    BP 120/79   Pulse 79   Temp 98.6 F (37 C)   Wt 139 lb (63 kg)   SpO2 99%   BMI 24.62 kg/m   Wt Readings from Last 3 Encounters:  04/06/17 139 lb (63 kg)  02/04/16 133 lb (60.3 kg)  11/25/15 135 lb 12 oz (61.6 kg)    Physical Exam  Constitutional: She is oriented to person, place, and time. She appears well-developed and well-nourished. No distress.  HENT:  Head: Atraumatic.  Right Ear: External ear normal.  Left Ear: External ear normal.  Nose: Nose normal.  Mouth/Throat: Oropharynx is clear and moist. No oropharyngeal exudate.  Eyes: Pupils are equal, round, and reactive to light. Conjunctivae are normal.  Neck: Normal range of motion. Neck supple.    Cardiovascular: Normal rate and normal heart sounds.   Pulmonary/Chest: Effort normal and breath sounds normal. No respiratory distress. She has no wheezes.  Musculoskeletal: Normal range of motion.  Lymphadenopathy:    She has no cervical adenopathy.  Neurological: She is alert and oriented to person, place, and time.  Skin: Skin is warm and dry.  Psychiatric: She has a normal mood and affect. Her behavior is normal.  Nursing note and vitals reviewed.     Assessment & Plan:   Problem List Items Addressed This Visit    None    Visit Diagnoses    Upper respiratory tract infection, unspecified type    -  Primary   Will treat with zpack, continue OTC remedies prn, supportive care reviewed. Follow up if no improvement   Relevant Medications   azithromycin (ZITHROMAX) 250 MG tablet       Follow up plan: Return if symptoms worsen or fail to improve.

## 2017-04-06 NOTE — Patient Instructions (Signed)
Follow up as needed

## 2017-09-04 IMAGING — DX DG CHEST 2V
2 series · 2 of 2 positions shown · non-contrast
Comparison: 04/14/2008

CLINICAL DATA: Chest pain

EXAM:
CHEST  2 VIEW

[chest pa]
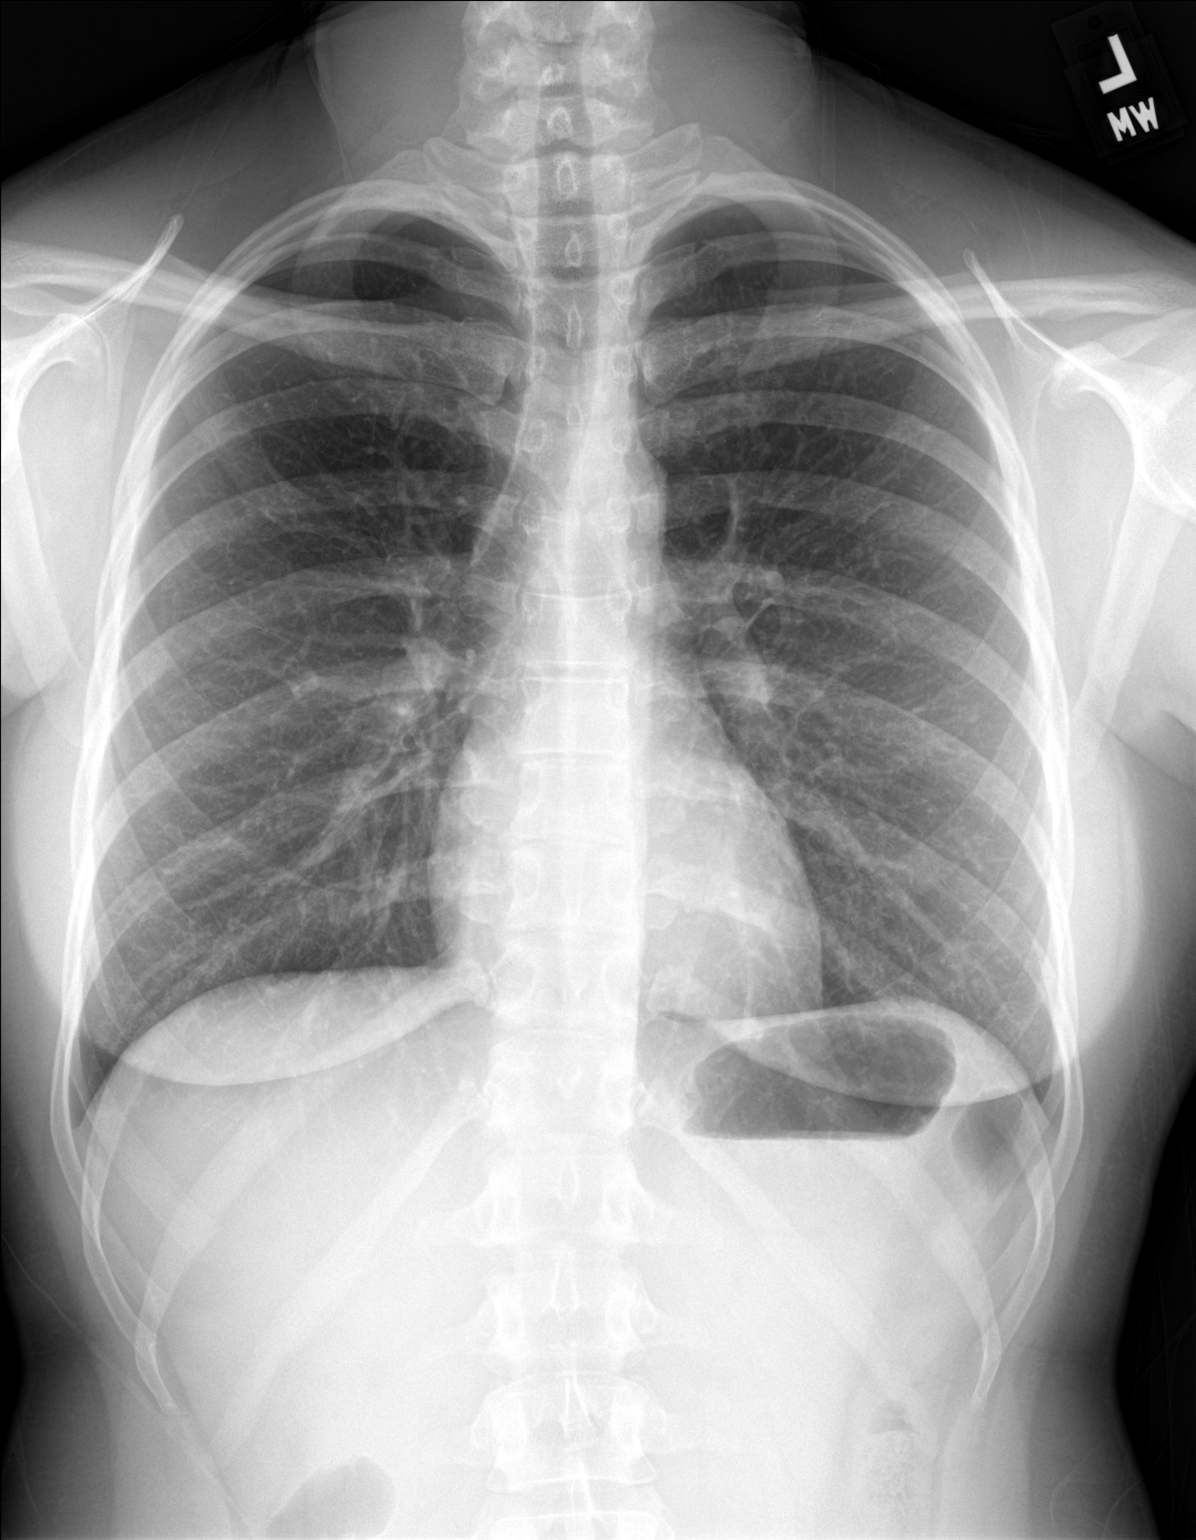

[chest lat]
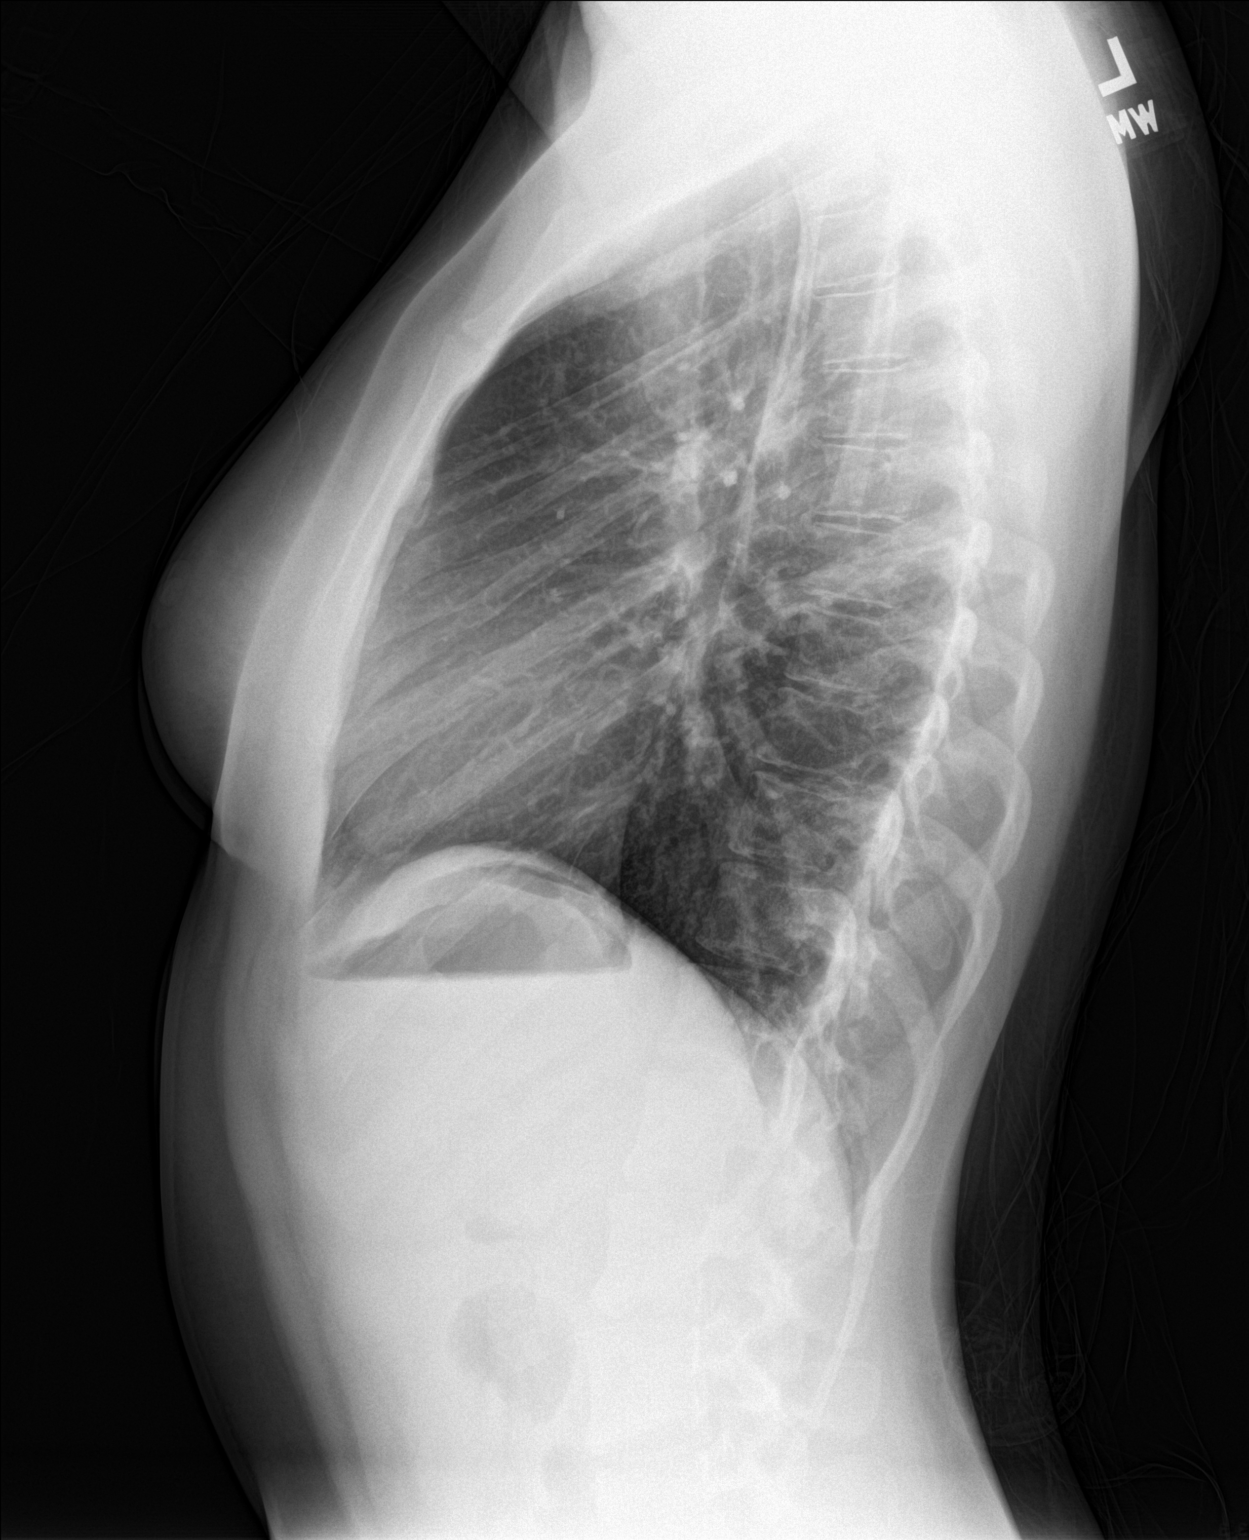

[2 of 2 positions shown; findings below may reference images not displayed]

FINDINGS: The heart size and mediastinal contours are within normal limits.
Both lungs are clear. The visualized skeletal structures are
unremarkable.
IMPRESSION: No active cardiopulmonary disease.

## 2017-09-21 ENCOUNTER — Encounter: Payer: Self-pay | Admitting: Unknown Physician Specialty

## 2017-09-21 ENCOUNTER — Ambulatory Visit (INDEPENDENT_AMBULATORY_CARE_PROVIDER_SITE_OTHER): Payer: BLUE CROSS/BLUE SHIELD | Admitting: Unknown Physician Specialty

## 2017-09-21 VITALS — BP 117/73 | HR 62 | Temp 98.4°F | Ht 63.6 in | Wt 141.5 lb

## 2017-09-21 DIAGNOSIS — Z23 Encounter for immunization: Secondary | ICD-10-CM | POA: Diagnosis not present

## 2017-09-21 DIAGNOSIS — R5383 Other fatigue: Secondary | ICD-10-CM | POA: Diagnosis not present

## 2017-09-21 DIAGNOSIS — Z0000 Encounter for general adult medical examination without abnormal findings: Secondary | ICD-10-CM

## 2017-09-21 NOTE — Assessment & Plan Note (Signed)
Persistent problem.  Pt thinks it might be Nexplanon issue.  Will check labs

## 2017-09-21 NOTE — Patient Instructions (Addendum)
Preventive Care 18-39 Years, Female Preventive care refers to lifestyle choices and visits with your health care provider that can promote health and wellness. What does preventive care include?  A yearly physical exam. This is also called an annual well check.  Dental exams once or twice a year.  Routine eye exams. Ask your health care provider how often you should have your eyes checked.  Personal lifestyle choices, including: ? Daily care of your teeth and gums. ? Regular physical activity. ? Eating a healthy diet. ? Avoiding tobacco and drug use. ? Limiting alcohol use. ? Practicing safe sex. ? Taking vitamin and mineral supplements as recommended by your health care provider. What happens during an annual well check? The services and screenings done by your health care provider during your annual well check will depend on your age, overall health, lifestyle risk factors, and family history of disease. Counseling Your health care provider may ask you questions about your:  Alcohol use.  Tobacco use.  Drug use.  Emotional well-being.  Home and relationship well-being.  Sexual activity.  Eating habits.  Work and work Statistician.  Method of birth control.  Menstrual cycle.  Pregnancy history.  Screening You may have the following tests or measurements:  Height, weight, and BMI.  Diabetes screening. This is done by checking your blood sugar (glucose) after you have not eaten for a while (fasting).  Blood pressure.  Lipid and cholesterol levels. These may be checked every 5 years starting at age 66.  Skin check.  Hepatitis C blood test.  Hepatitis B blood test.  Sexually transmitted disease (STD) testing.  BRCA-related cancer screening. This may be done if you have a family history of breast, ovarian, tubal, or peritoneal cancers.  Pelvic exam and Pap test. This may be done every 3 years starting at age 40. Starting at age 59, this may be done every 5  years if you have a Pap test in combination with an HPV test.  Discuss your test results, treatment options, and if necessary, the need for more tests with your health care provider. Vaccines Your health care provider may recommend certain vaccines, such as:  Influenza vaccine. This is recommended every year.  Tetanus, diphtheria, and acellular pertussis (Tdap, Td) vaccine. You may need a Td booster every 10 years.  Varicella vaccine. You may need this if you have not been vaccinated.  HPV vaccine. If you are 69 or younger, you may need three doses over 6 months.  Measles, mumps, and rubella (MMR) vaccine. You may need at least one dose of MMR. You may also need a second dose.  Pneumococcal 13-valent conjugate (PCV13) vaccine. You may need this if you have certain conditions and were not previously vaccinated.  Pneumococcal polysaccharide (PPSV23) vaccine. You may need one or two doses if you smoke cigarettes or if you have certain conditions.  Meningococcal vaccine. One dose is recommended if you are age 27-21 years and a first-year college student living in a residence hall, or if you have one of several medical conditions. You may also need additional booster doses.  Hepatitis A vaccine. You may need this if you have certain conditions or if you travel or work in places where you may be exposed to hepatitis A.  Hepatitis B vaccine. You may need this if you have certain conditions or if you travel or work in places where you may be exposed to hepatitis B.  Haemophilus influenzae type b (Hib) vaccine. You may need this if  you have certain risk factors.  Talk to your health care provider about which screenings and vaccines you need and how often you need them. This information is not intended to replace advice given to you by your health care provider. Make sure you discuss any questions you have with your health care provider. Document Released: 07/20/2001 Document Revised: 02/11/2016  Document Reviewed: 03/25/2015 Elsevier Interactive Patient Education  2018 [place] (Tetanus and Diphtheria): What You Need to Know 1. Why get vaccinated? Tetanus  and diphtheria are very serious diseases. They are rare in the [place] today, but people who do become infected often have severe complications. Td vaccine is used to protect adolescents and adults from both of these diseases. Both tetanus and diphtheria are infections caused by bacteria. Diphtheria spreads from person to person through coughing or sneezing. Tetanus-causing bacteria enter the body through cuts, scratches, or wounds. TETANUS (lockjaw) causes painful muscle tightening and stiffness, usually all over the body.  It can lead to tightening of muscles in the head and neck so you can't open your mouth, swallow, or sometimes even breathe. Tetanus kills about 1 out of every 10 people who are infected even after receiving the best medical care.  DIPHTHERIA can cause a thick coating to form in the back of the throat.  It can lead to breathing problems, paralysis, heart failure, and death.  Before vaccines, as many as 200,000 cases of diphtheria and hundreds of cases of tetanus were reported in the [place] each year. Since vaccination began, reports of cases for both diseases have dropped by about 99%. 2. Td vaccine Td vaccine can protect adolescents and adults from tetanus and diphtheria. Td is usually given as a booster dose every 10 years but it can also be given earlier after a severe and dirty wound or burn. Another vaccine, called Tdap, which protects against pertussis in addition to tetanus and diphtheria, is sometimes recommended instead of Td vaccine. Your doctor or the person giving you the vaccine can give you more information. Td may safely be given at the same time as other vaccines. 3. Some people should not get this vaccine  A person who has ever had a life-threatening allergic reaction  after a previous dose of any tetanus or diphtheria containing vaccine, OR has a severe allergy to any part of this vaccine, should not get Td vaccine. Tell the person giving the vaccine about any severe allergies.  Talk to your doctor if you: ? had severe pain or swelling after any vaccine containing diphtheria or tetanus, ? ever had a condition called Guillain Barre Syndrome (GBS), ? aren't feeling well on the day the shot is scheduled. 4. What are the risks from Td vaccine? With any medicine, including vaccines, there is a chance of side effects. These are usually mild and go away on their own. Serious reactions are also possible but are rare. Most people who get Td vaccine do not have any problems with it. Mild problems following Td vaccine: (Did not interfere with activities)  Pain where the shot was given (about 8 people in 10)  Redness or swelling where the shot was given (about 1 person in 4)  Mild fever (rare)  Headache (about 1 person in 4)  Tiredness (about 1 person in 4)  Moderate problems following Td vaccine: (Interfered with activities, but did not require medical attention)  Fever over 102F (rare)  Severe problems following Td vaccine: (Unable to perform usual activities; required medical attention)  Swelling, severe pain, bleeding and/or redness in the arm where the shot was given (rare).  Problems that could happen after any vaccine:  People sometimes faint after a medical procedure, including vaccination. Sitting or lying down for about 15 minutes can help prevent fainting, and injuries caused by a fall. Tell your doctor if you feel dizzy, or have vision changes or ringing in the ears.  Some people get severe pain in the shoulder and have difficulty moving the arm where a shot was given. This happens very rarely.  Any medication can cause a severe allergic reaction. Such reactions from a vaccine are very rare, estimated at fewer than 1 in a million doses, and  would happen within a few minutes to a few hours after the vaccination. As with any medicine, there is a very remote chance of a vaccine causing a serious injury or death. The safety of vaccines is always being monitored. For more information, visit: http://www.aguilar.org/ 5. What if there is a serious reaction? What should I look for? Look for anything that concerns you, such as signs of a severe allergic reaction, very high fever, or unusual behavior. Signs of a severe allergic reaction can include hives, swelling of the face and throat, difficulty breathing, a fast heartbeat, dizziness, and weakness. These would usually start a few minutes to a few hours after the vaccination. What should I do?  If you think it is a severe allergic reaction or other emergency that can't wait, call 9-1-1 or get the person to the nearest hospital. Otherwise, call your doctor.  Afterward, the reaction should be reported to the Vaccine Adverse Event Reporting System (VAERS). Your doctor might file this report, or you can do it yourself through the VAERS web site at www.vaers.SamedayNews.es, or by calling (203)753-5709. ? VAERS does not give medical advice. 6. The National Vaccine Injury Compensation Program The [employer] Vaccine Injury Compensation Program (VICP) is a federal program that was created to compensate people who may have been injured by certain vaccines. Persons who believe they may have been injured by a vaccine can learn about the program and about filing a claim by calling 564-056-2777 or visiting the [place] website at GoldCloset.com.ee. There is a time limit to file a claim for compensation. 7. How can I learn more?  Ask your doctor. He or she can give you the vaccine package insert or suggest other sources of information.  Call your local or state health department.  Contact the Centers for Disease Control and Prevention (CDC): ? Call 816-811-2707 (1-800-CDC-INFO) ? Visit CDC's  website at http://hunter.com/ CDC Td Vaccine VIS (09/16/15) This information is not intended to replace advice given to you by your health care provider. Make sure you discuss any questions you have with your health care provider. Document Released: 03/21/2006 Document Revised: 02/12/2016 Document Reviewed: 02/12/2016 Elsevier Interactive Patient Education  2017 [employer].

## 2017-09-21 NOTE — Progress Notes (Signed)
BP 117/73   Pulse 62   Temp 98.4 F (36.9 C) (Oral)   Ht 5' 3.6" (1.615 m)   Wt 141 lb 8 oz (64.2 kg)   LMP  (LMP Unknown)   SpO2 100%   BMI 24.59 kg/m    Subjective:    Patient ID: Valerie Odonnell, female    DOB: Jul 01, 1990, 27 y.o.   MRN: [id number]  HPI: Valerie Odonnell is a 27 y.o. female  Chief Complaint  Patient presents with  . Annual Exam   Pt is here for general history and physical.  Pap reviewed from 2017 and was normal  Next pap due 3 years in 2020.  No complaints today besides being tired most of the time with increased appetite. She wonders if related to Nexplanon  Relevant past medical, surgical, family and social history reviewed and updated as indicated. Interim medical history since our last visit reviewed. Allergies and medications reviewed and updated.  Review of Systems  Constitutional: Negative.   HENT: Negative.   Eyes: Negative.   Respiratory: Negative.   Cardiovascular: Negative.   Gastrointestinal: Negative.        Belching more  Endocrine: Negative.   Genitourinary: Negative.   Musculoskeletal: Negative.   Skin: Negative.   Allergic/Immunologic: Negative.   Neurological: Negative.   Hematological: Negative.   Psychiatric/Behavioral: Negative.     Per HPI unless specifically indicated above     Objective:    BP 117/73   Pulse 62   Temp 98.4 F (36.9 C) (Oral)   Ht 5' 3.6" (1.615 m)   Wt 141 lb 8 oz (64.2 kg)   LMP  (LMP Unknown)   SpO2 100%   BMI 24.59 kg/m   Wt Readings from Last 3 Encounters:  09/21/17 141 lb 8 oz (64.2 kg)  04/06/17 139 lb (63 kg)  02/04/16 133 lb (60.3 kg)    Physical Exam  Constitutional: She is oriented to person, place, and time. She appears well-developed and well-nourished.  HENT:  Head: Normocephalic and atraumatic.  Eyes: Pupils are equal, round, and reactive to light. Right eye exhibits no discharge. Left eye exhibits no discharge. No scleral icterus.  Neck: Normal range of motion. Neck  supple. Carotid bruit is not present. No thyromegaly present.  Cardiovascular: Normal rate, regular rhythm and normal heart sounds. Exam reveals no gallop and no friction rub.  No murmur heard. Pulmonary/Chest: Effort normal and breath sounds normal. No respiratory distress. She has no wheezes. She has no rales. No breast tenderness or discharge.  Abdominal: Soft. Bowel sounds are normal. There is no tenderness. There is no rebound.  Genitourinary: No breast tenderness or discharge.  Musculoskeletal: Normal range of motion.  Lymphadenopathy:    She has no cervical adenopathy.  Neurological: She is alert and oriented to person, place, and time.  Skin: Skin is warm, dry and intact. No rash noted.  Psychiatric: She has a normal mood and affect. Her speech is normal and behavior is normal. Judgment and thought content normal. Cognition and memory are normal.    Results for orders placed or performed in visit on 02/04/16  Microscopic Examination  Result Value Ref Range   WBC, UA 6-10 (A) 0 - 5 /hpf   RBC, UA 0-2 0 - 2 /hpf   Epithelial Cells (non renal) 0-10 0 - 10 /hpf   Bacteria, UA Moderate (A) None seen/Few  UA/M w/rflx Culture, Routine (STAT)  Result Value Ref Range   Specific Gravity, UA 1.025 1.005 -  1.030   pH, UA 7.0 5.0 - 7.5   Color, UA Yellow Yellow   Appearance Ur Cloudy (A) Clear   Leukocytes, UA 2+ (A) Negative   Protein, UA 1+ (A) Negative/Trace   Glucose, UA Negative Negative   Ketones, UA Negative Negative   RBC, UA 1+ (A) Negative   Bilirubin, UA Negative Negative   Urobilinogen, Ur 1.0 0.2 - 1.0 mg/dL   Nitrite, UA Negative Negative   Microscopic Examination See below:    Urinalysis Reflex Comment   Urine Culture, Routine  Result Value Ref Range   Urine Culture, Routine Final report (A)    Organism ID, Bacteria Escherichia coli (A)    Antimicrobial Susceptibility Comment       Assessment & Plan:   Problem List Items Addressed This Visit       Unprioritized   Fatigue    Persistent problem.  Pt thinks it might be Nexplanon issue.  Will check labs      Relevant Orders   Comprehensive metabolic panel   CBC with Differential/Platelet   TSH    Other Visit Diagnoses    Routine general medical examination at a health care facility    -  Primary   Relevant Orders   Lipid Panel w/o Chol/HDL Ratio   Annual physical exam       Need for Td vaccine       Relevant Orders   Td vaccine greater than or equal to 7yo preservative free IM      Discussed diet and exercise and car safety issues  HM:  Update tetnus Pap due next year  Follow up plan: Return in about 1 year (around 09/22/2018).

## 2017-09-22 LAB — COMPREHENSIVE METABOLIC PANEL
ALBUMIN: 4.4 g/dL (ref 3.5–5.5)
ALT: 18 IU/L (ref 0–32)
AST: 18 IU/L (ref 0–40)
Albumin/Globulin Ratio: 1.6 (ref 1.2–2.2)
Alkaline Phosphatase: 73 IU/L (ref 39–117)
BILIRUBIN TOTAL: 0.8 mg/dL (ref 0.0–1.2)
BUN / CREAT RATIO: 11 (ref 9–23)
BUN: 9 mg/dL (ref 6–20)
CHLORIDE: 102 mmol/L (ref 96–106)
CO2: 21 mmol/L (ref 20–29)
Calcium: 9.1 mg/dL (ref 8.7–10.2)
Creatinine, Ser: 0.82 mg/dL (ref 0.57–1.00)
GFR calc Af Amer: 115 mL/min/{1.73_m2} (ref >59)
GFR calc non Af Amer: 100 mL/min/{1.73_m2} (ref >59)
GLOBULIN, TOTAL: 2.7 g/dL (ref 1.5–4.5)
Glucose: 52 mg/dL — ABNORMAL LOW (ref 65–99)
POTASSIUM: 4.2 mmol/L (ref 3.5–5.2)
SODIUM: 140 mmol/L (ref 134–144)
Total Protein: 7.1 g/dL (ref 6.0–8.5)

## 2017-09-22 LAB — CBC WITH DIFFERENTIAL/PLATELET
BASOS ABS: 0 10*3/uL (ref 0.0–0.2)
Basos: 0 %
EOS (ABSOLUTE): 0 10*3/uL (ref 0.0–0.4)
Eos: 0 %
HEMATOCRIT: 42.6 % (ref 34.0–46.6)
Hemoglobin: 13.9 g/dL (ref 11.1–15.9)
Immature Grans (Abs): 0 10*3/uL (ref 0.0–0.1)
Immature Granulocytes: 0 %
LYMPHS ABS: 2.2 10*3/uL (ref 0.7–3.1)
Lymphs: 46 %
MCH: 28.7 pg (ref 26.6–33.0)
MCHC: 32.6 g/dL (ref 31.5–35.7)
MCV: 88 fL (ref 79–97)
MONOS ABS: 0.3 10*3/uL (ref 0.1–0.9)
Monocytes: 6 %
Neutrophils Absolute: 2.3 10*3/uL (ref 1.4–7.0)
Neutrophils: 48 %
PLATELETS: 289 10*3/uL (ref 150–379)
RBC: 4.85 x10E6/uL (ref 3.77–5.28)
RDW: 13.3 % (ref 12.3–15.4)
WBC: 4.8 10*3/uL (ref 3.4–10.8)

## 2017-09-22 LAB — TSH: TSH: 1.15 u[IU]/mL (ref 0.450–4.500)

## 2017-09-22 LAB — LIPID PANEL W/O CHOL/HDL RATIO
CHOLESTEROL TOTAL: 137 mg/dL (ref 100–199)
HDL: 44 mg/dL (ref >39)
LDL CALC: 83 mg/dL (ref 0–99)
Triglycerides: 51 mg/dL (ref 0–149)
VLDL Cholesterol Cal: 10 mg/dL (ref 5–40)

## 2017-09-22 NOTE — Progress Notes (Signed)
Notified pt by mychart
